# Patient Record
Sex: Male | Born: 1970 | Hispanic: Yes | Marital: Married | Smoking: Former smoker
Health system: Southern US, Community
[De-identification: ages and names within clinical notes are randomized; demographics above are authoritative.]

---

## 2014-07-10 ENCOUNTER — Emergency Department (HOSPITAL_COMMUNITY)
Admission: EM | Admit: 2014-07-10 | Discharge: 2014-07-10 | Disposition: A | Discharge status: Home / Self Care | Payer: Worker's Compensation | Attending: Emergency Medicine | Admitting: Emergency Medicine

## 2014-07-10 ENCOUNTER — Encounter (HOSPITAL_COMMUNITY): Payer: Self-pay | Admitting: Emergency Medicine

## 2014-07-10 DIAGNOSIS — Y929 Unspecified place or not applicable: Secondary | ICD-10-CM | POA: Insufficient documentation

## 2014-07-10 DIAGNOSIS — M545 Low back pain, unspecified: Secondary | ICD-10-CM | POA: Insufficient documentation

## 2014-07-10 DIAGNOSIS — Y939 Activity, unspecified: Secondary | ICD-10-CM | POA: Insufficient documentation

## 2014-07-10 DIAGNOSIS — S335XXA Sprain of ligaments of lumbar spine, initial encounter: Secondary | ICD-10-CM | POA: Insufficient documentation

## 2014-07-10 DIAGNOSIS — X58XXXA Exposure to other specified factors, initial encounter: Secondary | ICD-10-CM | POA: Diagnosis not present

## 2014-07-10 DIAGNOSIS — S39012A Strain of muscle, fascia and tendon of lower back, initial encounter: Secondary | ICD-10-CM

## 2014-07-10 MED ORDER — OXYCODONE-ACETAMINOPHEN 5-325 MG PO TABS
1.0000 | ORAL_TABLET | Freq: Once | ORAL | Status: AC
  Administered 2014-07-10: 1 via ORAL
  Filled 2014-07-10 (×2): qty 1

## 2014-07-10 MED ORDER — OXYCODONE-ACETAMINOPHEN 5-325 MG PO TABS
1.0000 | ORAL_TABLET | Freq: Once | ORAL | Status: AC
Start: 2014-07-10 — End: 2014-07-10
  Administered 2014-07-10: 1 via ORAL
  Filled 2014-07-10: qty 1

## 2014-07-10 MED ORDER — OXYCODONE-ACETAMINOPHEN 5-325 MG PO TABS: 1.0000 | ORAL_TABLET | ORAL | Status: DC | PRN

## 2014-07-10 NOTE — ED Notes (Signed)
Pt stated he fell off a tractor trailer onto concrete on his back on last Thursday and was seen at [facility] for injury. PT c/o worsening in lower back pain into his legs.

## 2014-07-10 NOTE — Discharge Instructions (Signed)
Dolor de espalda en el adulto °(Back Pain, Adult) ° El dolor de cintura es frecuente. Aproximadamente 1 de cada 5 personas lo sufren. La causa rara vez pone en peligro la vida. Con frecuencia mejora luego de algún tiempo. Alrededor de la mitad de las personas que sufren un inicio súbito de dolor de cintura, se sentirán mejor luego de 2 semanas. Aproximadamente 8 de cada 10 se sentirán mejor luego de 6 semanas.  °CAUSAS  °Algunas causas comunes son:  °· Distensión de los músculos o ligamentos que sostienen la columna vertebral. °· Desgaste (degeneración) de los discos vertebrales. °· Artritis. °· Traumatismos directos en la espalda. °DIAGNÓSTICO  °La mayor parte de las veces, la causa directa no se conoce. Sin embargo, el dolor puede tratarse efectivamente aún cuando no se conozca la causa. Una de las formas más precisas de asegurar que la causa del dolor no constituye un peligro es responder a las preguntas del médico acerca de su salud y sus síntomas. Si el médico necesita más información, podrá indicar análisis de laboratorio o realizar un diagnóstico por imágenes (radiografías o resonancia magnética). Sin embargo, aunque las imágenes muestren modificaciones, generalmente no es necesaria la cirugía.  °INSTRUCCIONES PARA EL CUIDADO EN EL HOGAR  °En algunas personas, el dolor de espalda vuelve. Como rara vez es peligroso, los pacientes pueden aprender a manejarlo ellos mismos.  °· Manténgase activo. Si permanece sentado o de pie mucho tiempo en el mismo lugar, se tensiona la espalda.  No se siente, maneje ni se quede parado en un mismo lugar por más de 30 minutos. Realice caminatas cortas en superficies planas ni bien el dolor haya cedido. Trate de aumentar cada día el tiempo que camina . °· No se quede en la cama. Si hace reposo durante más de 1 o 2 días, puede retrasar la recuperación. °· No evite los ejercicios ni el trabajo. El cuerpo está hecho para moverse. No es peligroso estar activo, aunque le duela la  espalda. La espalda se curará más rápido si continúa sus actividades antes de que el dolor se vaya. °· Preste atención a su cuerpo cuando se incline y se levante. Muchas personas sienten menos molestias cuando levantan objetos si doblan las rodillas, mantienen la carga cerca del cuerpo y evitan torcerse. Generalmente, las posiciones más cómodas son las que ejercen menos tensión en la espalda en recuperación. °· Encuentre una posición cómoda para dormir. Utilice un colchón firme y recuéstese de costado. Doble ligeramente sus rodillas. Si se recuesta sobre su espalda, coloque una almohada debajo de sus rodillas. °· Tome sólo medicamentos de venta libre o recetados, según las indicaciones del médico. Los medicamentos de venta libre para calmar el dolor y reducir la inflamación, son los que en general más ayudan. El médico podrá prescribirle relajantes musculares. Estos medicamentos calman el dolor de modo que pueda retornar a sus actividades normales y a realizar ejercicios saludables. °· Aplique hielo sobre la zona lesionada. °¨ Ponga el hielo en una bolsa plástica. °¨ Colóquese una toalla entre la piel y la bolsa de hielo. °¨ Deje la bolsa de hielo durante 15 a 20 minutos 3 a 4 veces por día, durante los primeros 2 ó 3 días. Luego podrá alternar entre calor y hielo para reducir el dolor y los espasmos. °· Consulte a su médico si puede tratar de hacer ejercicios para la espalda y recibir un masaje suave. Pueden ser beneficiosos. °· Evite sentirse ansioso o estresado. El estrés aumenta la tensión muscular y puede empeorar el dolor de espalda. Es importante reconocer cuando está ansioso o estresado y aprender la forma   de controlarlos. El ejercicio es una gran opción. °SOLICITE ATENCIÓN MÉDICA SI:  °· Siente un dolor que no se alivia con reposo o medicamentos. °· El dolor no mejora en 1 semana. °· Desarrolla nuevos síntomas. °· No se siente bien en general. °SOLICITE ATENCIÓN MÉDICA DE INMEDIATO SI: °· Siente un dolor  que se irradia desde la espalda hacia sus piernas. °· Desarrolla nuevos problemas en el intestino o la vejiga. °· Siente debilidad o adormecimiento inusual en sus brazos o piernas. °· Presenta náuseas o vómitos. °· Presenta dolor abdominal. °· Se siente desfalleciente. °Document Released: 09/28/2005 Document Revised: 03/29/2012 °Exit[employer]® Patient Information ©2015 [employer]. This information is not intended to replace advice given to you by your health care provider. Make sure you discuss any questions you have with your health care provider. ° °

## 2014-07-10 NOTE — ED Provider Notes (Signed)
CSN: [id number]     Arrival date & time 07/10/14  1207 History  This chart was scribed for Joya Gaskins, MD by Bronson Curb, ED Scribe. This patient was seen in room APA12/APA12 and the patient's care was started at 12:41 PM.    Chief Complaint  Patient presents with  . Back Pain      Patient is a 43 y.o. male presenting with back pain. The history is provided by the patient. No language interpreter was used.  Back Pain Location:  Lumbar spine Radiates to: Right leg. Pain severity:  Moderate Pain is:  Same all the time Onset quality:  Sudden Duration:  5 days Timing:  Constant Progression:  Unchanged Chronicity:  New Context: falling   Ineffective treatments:  Ibuprofen Associated symptoms: no abdominal pain, no bladder incontinence, no bowel incontinence, no chest pain and no numbness     HPI Comments: Terry Friedman is a 43 y.o. male who presents to the Emergency Department complaining of gradually worsening, constant lower back pain that began after a fall 5 days ago, when he fell off a tractor-trailer truck landing on his back (against concrete)  5 days ago. Patient denies hitting his head or LOC. He states the pain radiates to his right leg. Patient was seen at [facility] for his injury where X-rays were performed on July 05, 2014 which revealed unremarkable findings. He was informed to take Motrin for his pain, however, patient states this has been ineffective. He denies CP, abdominal pain, numbness/weakness, hematuria, bowel/bladder incontinence. Patient denies any previous back surgeries and has no history of significant health conditions.  PMH - none History  Substance Use Topics  . Smoking status: Never Smoker   . Smokeless tobacco: Not on file  . Alcohol Use: Yes     Comment: social     Review of Systems  Cardiovascular: Negative for chest pain.  Gastrointestinal: Negative for abdominal pain and bowel incontinence.  Genitourinary: Negative for  bladder incontinence.  Musculoskeletal: Positive for back pain.  Neurological: Negative for numbness.      Allergies  Review of patient's allergies indicates no known allergies.  Home Medications   Prior to Admission medications   Not on File   Triage Vitals: BP 108/67  Pulse 57  Temp(Src) 97.6 F (36.4 C) (Oral)  Resp 20  Ht 5\' 8"  (1.727 m)  Wt 160 lb (72.576 kg)  BMI 24.33 kg/m2  SpO2 100%  Physical Exam CONSTITUTIONAL: Well developed/well nourished HEAD: Normocephalic/atraumatic EYES: EOMI/PERRL ENMT: Mucous membranes moist NECK: supple no meningeal signs SPINE:entire spine nontender, lumber paraspinal tenderness. No bruising/crepitance/stepoffs noted to spine CV: S1/S2 noted, no murmurs/rubs/gallops noted LUNGS: Lungs are clear to auscultation bilaterally, no apparent distress ABDOMEN: soft, nontender, no rebound or guarding GU:no cva tenderness NEURO: Awake/alert, equal motor 5/5 strength noted with the following: hip flexion/knee flexion/extension, foot dorsi/plantar flexion, great toe extension intact bilaterally, no clonus bilaterally, no sensory deficit in any dermatome.  Equal patellar/achilles reflex noted (2+) in bilateral lower extremities.  Pt is able to ambulate unassisted. EXTREMITIES: pulses normal, full ROM SKIN: warm, color normal. Circular lesions to back from recent massage. PSYCH: no abnormalities of mood noted   ED Course  Procedures   DIAGNOSTIC STUDIES: Oxygen Saturation is 100% on room air, normal by my interpretation.    COORDINATION OF CARE: At 1251 Discussed treatment plan with patient which includes Percocet. Patient agrees.   xrays from [place] [facility] reviewed - no acute lumbar/pelvic fracture (reports from 9/24 noted in  care-everywhere) No new injury since that time.  Defer further imaging at this time Pt ambulatory, no distress, no focal neuro deficits Will give percocet and advised f/u as outpatient With spanish phone interpreter  [phone] we discussed strict return precautions as well as appropriate use of percocet MDM   Final diagnoses:  Lumbar strain, initial encounter    Nursing notes including past medical history and social history reviewed and considered in documentation Previous records reviewed and considered   I personally performed the services described in this documentation, which was scribed in my presence. The recorded information has been reviewed and is accurate.      Joya Gaskinsonald W Emmalea Treanor, MD 07/10/14 1310

## 2014-08-19 ENCOUNTER — Ambulatory Visit: Payer: Self-pay | Admitting: Orthopedic Surgery

## 2014-08-20 ENCOUNTER — Encounter (HOSPITAL_COMMUNITY): Payer: Self-pay

## 2014-08-20 ENCOUNTER — Ambulatory Visit: Payer: Self-pay | Admitting: Orthopedic Surgery

## 2014-08-20 ENCOUNTER — Ambulatory Visit (HOSPITAL_COMMUNITY)
Admission: RE | Admit: 2014-08-20 | Discharge: 2014-08-20 | Disposition: A | Discharge status: Home / Self Care | Payer: Worker's Compensation | Source: Ambulatory Visit | Attending: Specialist | Admitting: Specialist

## 2014-08-20 ENCOUNTER — Encounter (HOSPITAL_COMMUNITY)
Admission: RE | Admit: 2014-08-20 | Discharge: 2014-08-20 | Disposition: A | Discharge status: Home / Self Care | Payer: Worker's Compensation | Source: Ambulatory Visit | Attending: Specialist | Admitting: Specialist

## 2014-08-20 DIAGNOSIS — M5126 Other intervertebral disc displacement, lumbar region: Secondary | ICD-10-CM

## 2014-08-20 DIAGNOSIS — M545 Low back pain: Secondary | ICD-10-CM | POA: Insufficient documentation

## 2014-08-20 DIAGNOSIS — Z01818 Encounter for other preprocedural examination: Secondary | ICD-10-CM

## 2014-08-20 LAB — CBC
HCT: 44.9 % (ref 39.0–52.0)
Hemoglobin: 15.1 g/dL (ref 13.0–17.0)
MCH: 30.3 pg (ref 26.0–34.0)
MCHC: 33.6 g/dL (ref 30.0–36.0)
MCV: 90.2 fL (ref 78.0–100.0)
PLATELETS: 222 10*3/uL (ref 150–400)
RBC: 4.98 MIL/uL (ref 4.22–5.81)
RDW: 12.1 % (ref 11.5–15.5)
WBC: 6.9 10*3/uL (ref 4.0–10.5)

## 2014-08-20 LAB — BASIC METABOLIC PANEL
ANION GAP: 11 (ref 5–15)
BUN: 11 mg/dL (ref 6–23)
CO2: 27 meq/L (ref 19–32)
Calcium: 9.3 mg/dL (ref 8.4–10.5)
Chloride: 100 mEq/L (ref 96–112)
Creatinine, Ser: 0.81 mg/dL (ref 0.50–1.35)
GFR calc Af Amer: >90 mL/min (ref >90)
Glucose, Bld: 104 mg/dL — ABNORMAL HIGH (ref 70–99)
Potassium: 3.9 mEq/L (ref 3.7–5.3)
SODIUM: 138 meq/L (ref 137–147)

## 2014-08-20 LAB — SURGICAL PCR SCREEN
MRSA, PCR: NEGATIVE
Staphylococcus aureus: NEGATIVE

## 2014-08-20 NOTE — Patient Instructions (Addendum)
Terry Friedman  08/20/2014   Your procedure is scheduled on:08/22/2014      Come thru the [facility] entrance .  Follow the Signs to Short Stay Center at  0845      am  Call this number if you have problems the morning of surgery: [phone]   Remember:   Do not eat food or drink liquids after midnight.   Take these medicines the morning of surgery with A SIP OF WATER: oxycodone if needed    Do not wear jewelry,   Do not wear lotions, powders, or perfumes. deodorant.   Men may shave face and neck.  Do not bring valuables to the hospital.  Contacts, dentures or bridgework may not be worn into surgery.  Leave suitcase in the car. After surgery it may be brought to your room.  For patients admitted to the hospital, checkout time is 11:00 AM the day of  discharge.       [place] - Preparing for Surgery Before surgery, you can play an important role.  Because skin is not sterile, your skin needs to be as free of germs as possible.  You can reduce the number of germs on your skin by washing with CHG (chlorahexidine gluconate) soap before surgery.  CHG is an antiseptic cleaner which kills germs and bonds with the skin to continue killing germs even after washing. Please DO NOT use if you have an allergy to CHG or antibacterial soaps.  If your skin becomes reddened/irritated stop using the CHG and inform your nurse when you arrive at Short Stay. Do not shave (including legs and underarms) for at least 48 hours prior to the first CHG shower.  You may shave your face/neck. Please follow these instructions carefully:  1.  Shower with CHG Soap the night before surgery and the  morning of Surgery.  2.  If you choose to wash your hair, wash your hair first as usual with your  normal  shampoo.  3.  After you shampoo, rinse your hair and body thoroughly to remove the  shampoo.                           4.  Use CHG as you would any other liquid soap.  You can apply chg directly  to the skin and wash                        Gently with a scrungie or clean washcloth.  5.  Apply the CHG Soap to your body ONLY FROM THE NECK DOWN.   Do not use on face/ open                           Wound or open sores. Avoid contact with eyes, ears mouth and genitals (private parts).                       Wash face,  Genitals (private parts) with your normal soap.             6.  Wash thoroughly, paying special attention to the area where your surgery  will be performed.  7.  Thoroughly rinse your body with warm water from the neck down.  8.  DO NOT shower/wash with your normal soap after using and rinsing off  the CHG Soap.  9.  Pat yourself dry with a clean towel.            10.  Wear clean pajamas.            11.  Place clean sheets on your bed the night of your first shower and do not  sleep with pets. Day of Surgery : Do not apply any lotions/deodorants the morning of surgery.  Please wear clean clothes to the hospital/surgery center.  FAILURE TO FOLLOW THESE INSTRUCTIONS MAY RESULT IN THE CANCELLATION OF YOUR SURGERY PATIENT SIGNATURE_________________________________  NURSE SIGNATURE__________________________________  ________________________________________________________________________    Please read over the following fact sheets that you were given: MRSA Information, coughing and deep breathing exercises, leg exercises            [place] - Preparing for Surgery Before surgery, you can play an important role.  Because skin is not sterile, your skin needs to be as free of germs as possible.  You can reduce the number of germs on your skin by washing with CHG (chlorahexidine gluconate) soap before surgery.  CHG is an antiseptic cleaner which kills germs and bonds with the skin to continue killing germs even after washing. Please DO NOT use if you have an allergy to CHG or antibacterial soaps.  If your skin becomes reddened/irritated stop using the CHG and inform your nurse when  you arrive at Short Stay. Do not shave (including legs and underarms) for at least 48 hours prior to the first CHG shower.  You may shave your face/neck. Please follow these instructions carefully:  1.  Shower with CHG Soap the night before surgery and the  morning of Surgery.  2.  If you choose to wash your hair, wash your hair first as usual with your  normal  shampoo.  3.  After you shampoo, rinse your hair and body thoroughly to remove the  shampoo.                           4.  Use CHG as you would any other liquid soap.  You can apply chg directly  to the skin and wash                       Gently with a scrungie or clean washcloth.  5.  Apply the CHG Soap to your body ONLY FROM THE NECK DOWN.   Do not use on face/ open                           Wound or open sores. Avoid contact with eyes, ears mouth and genitals (private parts).                       Wash face,  Genitals (private parts) with your normal soap.             6.  Wash thoroughly, paying special attention to the area where your surgery  will be performed.  7.  Thoroughly rinse your body with warm water from the neck down.  8.  DO NOT shower/wash with your normal soap after using and rinsing off  the CHG Soap.                9.  Pat yourself dry with a clean towel.            10.  Wear clean  pajamas.            11.  Place clean sheets on your bed the night of your first shower and do not  sleep with pets. Day of Surgery : Do not apply any lotions/deodorants the morning of surgery.  Please wear clean clothes to the hospital/surgery center.  FAILURE TO FOLLOW THESE INSTRUCTIONS MAY RESULT IN THE CANCELLATION OF YOUR SURGERY PATIENT SIGNATURE_________________________________  NURSE SIGNATURE__________________________________  ________________________________________________________________________

## 2014-08-20 NOTE — H&P (Signed)
Terry Friedman is an 43 y.o. male.   Chief Complaint: back and leg pain HPI: The patient is a 43 year old male who presents today for follow up of their back. The patient is being followed for their mid and low back pain. They are now 6 week(s) out from injury (DOI 07/05/2014). Symptoms reported today include: pain (back) and leg pain (right). Current treatment includes: activity modification, NSAIDs and pain medications. The following medication has been used for pain control: Percocet and ibuprofen. The patient presents today following MRI. Note for "Follow-up back": The patient is out of work. NCM Terry Friedman. The patient follows up with case manager, Terry Friedman.  He is still having left lower extremity radicular pain.  He is here with his translator and Terry Friedman, case manager.    Therefore he is still having significant pain radiating down predominantly down to the left leg. His midthoracic pain is still present, however better.   No past medical history on file.  No past surgical history on file.  No family history on file. Social History:  reports that he has never smoked. He does not have any smokeless tobacco history on file. He reports that he drinks alcohol. He reports that he does not use illicit drugs.  Allergies: No Known Allergies   (Not in a hospital admission)  No results found for this or any previous visit (from the past 48 hour(s)). No results found.  Review of Systems  Constitutional: Negative.   HENT: Negative.   Eyes: Negative.   Respiratory: Negative.   Cardiovascular: Negative.   Gastrointestinal: Negative.   Genitourinary: Negative.   Musculoskeletal: Positive for back pain.  Skin: Negative.   Neurological: Positive for sensory change and focal weakness.  Psychiatric/Behavioral: Negative.     There were no vitals taken for this visit. Physical Exam  Constitutional: He is oriented to person, place, and time. He appears well-developed and  well-nourished.  HENT:  Head: Normocephalic and atraumatic.  Eyes: Conjunctivae and EOM are normal. Pupils are equal, round, and reactive to light.  Neck: Normal range of motion. Neck supple.  Cardiovascular: Normal rate and regular rhythm.   Respiratory: Effort normal and breath sounds normal.  GI: Soft. Bowel sounds are normal.  Musculoskeletal:  He has some pain in the thoracic region with palpation.  He is tender in the left proximal region. Her straight leg raise on the left produces marked buttock, thigh, and calf pain exacerbated with dorsal augmentation maneuvers.  Contralateral straight leg raise is negative.  EHL and plantar flexion is 4+/5.  He has altered sensation in the S1 dermatome.  Decreased Achilles reflex.  No instability of hips, knees, and ankles.  Pelvis is stable.  Abdomen is soft.  Inspection of the cervical spine reveals a normal lordosis without evidence of paraspinous spasms or soft tissue swelling. Nontender to palpation. Full flexion, full extension, full left and right lateral rotation. Extension combined with lateral flexion does not reproduce pain. Negative impingement sign, negative secondary impingement sign of the shoulders. Negative Tinel's median and ulnar nerves at the elbow. Negative carpal compression test at the wrist. Motor of the upper extremities is 5/5 including biceps, triceps, brachioradialis,wrist flexion, wrist extension, finger flexion, finger extension. Reflexes are normoreflexic. Sensory exam is intact to light touch. There is no Hoffmann sign. Nontender over the thoracic spine.   Neurological: He is alert and oriented to person, place, and time. He has normal reflexes.  Skin: Skin is warm and dry.  Psychiatric: He   has a normal mood and affect.    MRI of the thoracic spine demonstrates mild degenerative changes at T5-6 and small profusion at 5-6.   MRI of his lumbar spine demonstrates large disc herniation paracentral to the left, contacts, but  doesn't displace the S1 nerve root. It does displace the S1 nerve root to the left.  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  He has had intermittent left and right sided pain.   Given that he is 6 weeks from his injury and has the presence of a large neurocompressive lesion with a neurotension sign and neurologic deficit, it would be wise to consider lumbar decompression.  The other option is to live with his symptoms.  He indicates he would not like to live with his symptoms.  Through his translator we discussed lumbar decompression.   I had an extensive discussion of the risks and benefits of the lumbar decompression with the patient including bleeding, infection, damage to neurovascular structures, epidural fibrosis, CSF leak requiring repair. We also discussed increase in pain, adjacent segment disease, recurrent disc herniation, need for future surgery including repeat decompression and/or fusion. We also discussed risks of postoperative hematoma, paralysis, anesthetic complications including DVT, PE, death, cardiopulmonary dysfunction. In addition, the perioperative and postoperative courses were discussed in detail including the rehabilitative time and return to functional activity and work. I provided the patient with an illustrated handout and utilized the appropriate surgical models.   We used illustrated handouts and models, extensively the postoperative course including squatting and bending and unsupported bending. We have discussed overnight in the hospital, 2 weeks until suture removal, physical therapy for 6 weeks, and then work conditioning following that with maximal medical improvement at 3 months, possible light duty at 6 weeks.  He has no significant medical history.  He is otherwise healthy. No allergy.  No exposure to MRSA.  Again we  emphasized due to the communication challenge, the postoperative course to diminish the risk of recurrent disc herniation. No tobacco use.  With the kind assistance of his translator and Terry Friedman, case manager, this kindly helped in the postoperative discussion. We will proceed accordingly. If he has any further questions or concerns, he can feel free to [facility] for further discussion.   Plan microlumbar decompression L5-S1 left  BISSELL, Terry Friedman for Dr. Shelle IronBeane 08/20/2014, 11:01 AM

## 2014-08-22 ENCOUNTER — Ambulatory Visit (HOSPITAL_COMMUNITY): Payer: Worker's Compensation

## 2014-08-22 ENCOUNTER — Ambulatory Visit (HOSPITAL_COMMUNITY)
Admission: RE | Admit: 2014-08-22 | Discharge: 2014-08-23 | Disposition: A | Discharge status: Home / Self Care | Payer: Worker's Compensation | Source: Ambulatory Visit | Attending: Specialist | Admitting: Specialist

## 2014-08-22 ENCOUNTER — Encounter (HOSPITAL_COMMUNITY): Payer: Self-pay

## 2014-08-22 ENCOUNTER — Encounter (HOSPITAL_COMMUNITY)
Admission: RE | Disposition: A | Discharge status: Home / Self Care | Payer: Self-pay | Source: Ambulatory Visit | Attending: Specialist

## 2014-08-22 ENCOUNTER — Ambulatory Visit (HOSPITAL_COMMUNITY): Payer: Worker's Compensation | Admitting: Anesthesiology

## 2014-08-22 DIAGNOSIS — M5127 Other intervertebral disc displacement, lumbosacral region: Secondary | ICD-10-CM | POA: Diagnosis not present

## 2014-08-22 DIAGNOSIS — M5126 Other intervertebral disc displacement, lumbar region: Secondary | ICD-10-CM | POA: Diagnosis present

## 2014-08-22 DIAGNOSIS — Z419 Encounter for procedure for purposes other than remedying health state, unspecified: Secondary | ICD-10-CM

## 2014-08-22 DIAGNOSIS — Z87891 Personal history of nicotine dependence: Secondary | ICD-10-CM | POA: Diagnosis not present

## 2014-08-22 DIAGNOSIS — M4806 Spinal stenosis, lumbar region: Secondary | ICD-10-CM | POA: Diagnosis present

## 2014-08-22 HISTORY — PX: LUMBAR LAMINECTOMY/DECOMPRESSION MICRODISCECTOMY: SHX5026

## 2014-08-22 SURGERY — LUMBAR LAMINECTOMY/DECOMPRESSION MICRODISCECTOMY
Anesthesia: General | Site: Back | Laterality: Left

## 2014-08-22 MED ORDER — SODIUM CHLORIDE 0.9 % IV SOLN: 250.0000 mL | INTRAVENOUS | Status: DC

## 2014-08-22 MED ORDER — CEFAZOLIN SODIUM-DEXTROSE 2-3 GM-% IV SOLR
2.0000 g | INTRAVENOUS | Status: AC
  Administered 2014-08-22: 2 g via INTRAVENOUS

## 2014-08-22 MED ORDER — GLYCOPYRROLATE 0.2 MG/ML IJ SOLN
INTRAMUSCULAR | Status: DC | PRN
  Administered 2014-08-22: .4 mg via INTRAVENOUS

## 2014-08-22 MED ORDER — SUCCINYLCHOLINE CHLORIDE 20 MG/ML IJ SOLN
INTRAMUSCULAR | Status: DC | PRN
  Administered 2014-08-22: 120 mg via INTRAVENOUS

## 2014-08-22 MED ORDER — LACTATED RINGERS IV SOLN
INTRAVENOUS | Status: DC
  Administered 2014-08-22: 11:00:00 via INTRAVENOUS

## 2014-08-22 MED ORDER — ONDANSETRON HCL 4 MG/2ML IJ SOLN: 4.0000 mg | INTRAMUSCULAR | Status: DC | PRN

## 2014-08-22 MED ORDER — LIDOCAINE HCL (CARDIAC) 20 MG/ML IV SOLN
INTRAVENOUS | Status: DC | PRN
  Administered 2014-08-22: 75 mg via INTRAVENOUS

## 2014-08-22 MED ORDER — ALUM & MAG HYDROXIDE-SIMETH 200-200-20 MG/5ML PO SUSP: 30.0000 mL | Freq: Four times a day (QID) | ORAL | Status: DC | PRN

## 2014-08-22 MED ORDER — BUPIVACAINE-EPINEPHRINE (PF) 0.5% -1:200000 IJ SOLN
INTRAMUSCULAR | Status: AC
  Filled 2014-08-22: qty 30

## 2014-08-22 MED ORDER — OXYCODONE-ACETAMINOPHEN 5-325 MG PO TABS: 1.0000 | ORAL_TABLET | ORAL | Status: AC | PRN

## 2014-08-22 MED ORDER — NEOSTIGMINE METHYLSULFATE 10 MG/10ML IV SOLN
INTRAVENOUS | Status: AC
  Filled 2014-08-22: qty 1

## 2014-08-22 MED ORDER — KCL IN DEXTROSE-NACL 20-5-0.45 MEQ/L-%-% IV SOLN
INTRAVENOUS | Status: DC
  Administered 2014-08-22 (×2): via INTRAVENOUS
  Filled 2014-08-22 (×3): qty 1000

## 2014-08-22 MED ORDER — THROMBIN 5000 UNITS EX SOLR
CUTANEOUS | Status: AC
  Filled 2014-08-22: qty 10000

## 2014-08-22 MED ORDER — BUPIVACAINE-EPINEPHRINE 0.5% -1:200000 IJ SOLN
INTRAMUSCULAR | Status: DC | PRN
  Administered 2014-08-22: 15 mL

## 2014-08-22 MED ORDER — HYDROMORPHONE HCL 1 MG/ML IJ SOLN: 0.2500 mg | INTRAMUSCULAR | Status: DC | PRN

## 2014-08-22 MED ORDER — FENTANYL CITRATE 0.05 MG/ML IJ SOLN
INTRAMUSCULAR | Status: AC
  Filled 2014-08-22: qty 5

## 2014-08-22 MED ORDER — GLYCOPYRROLATE 0.2 MG/ML IJ SOLN
INTRAMUSCULAR | Status: AC
  Filled 2014-08-22: qty 3

## 2014-08-22 MED ORDER — BISACODYL 10 MG RE SUPP
10.0000 mg | Freq: Every day | RECTAL | Status: DC | PRN
Start: 2014-08-22 — End: 2014-08-23

## 2014-08-22 MED ORDER — PHENOL 1.4 % MT LIQD
1.0000 | OROMUCOSAL | Status: DC | PRN
Start: 2014-08-22 — End: 2014-08-22
  Filled 2014-08-22: qty 177

## 2014-08-22 MED ORDER — METHOCARBAMOL 1000 MG/10ML IJ SOLN
500.0000 mg | Freq: Four times a day (QID) | INTRAVENOUS | Status: DC | PRN
  Administered 2014-08-22: 500 mg via INTRAVENOUS
  Filled 2014-08-22 (×2): qty 5

## 2014-08-22 MED ORDER — LACTATED RINGERS IV SOLN: INTRAVENOUS | Status: DC

## 2014-08-22 MED ORDER — NEOSTIGMINE METHYLSULFATE 10 MG/10ML IV SOLN
INTRAVENOUS | Status: DC | PRN
  Administered 2014-08-22: 4 mg via INTRAVENOUS

## 2014-08-22 MED ORDER — CISATRACURIUM BESYLATE (PF) 10 MG/5ML IV SOLN
INTRAVENOUS | Status: DC | PRN
  Administered 2014-08-22: 8 mg via INTRAVENOUS

## 2014-08-22 MED ORDER — ACETAMINOPHEN 650 MG RE SUPP: 650.0000 mg | RECTAL | Status: DC | PRN

## 2014-08-22 MED ORDER — SODIUM CHLORIDE 0.9 % IJ SOLN: 3.0000 mL | Freq: Two times a day (BID) | INTRAMUSCULAR | Status: DC

## 2014-08-22 MED ORDER — CISATRACURIUM BESYLATE 20 MG/10ML IV SOLN
INTRAVENOUS | Status: AC
  Filled 2014-08-22: qty 10

## 2014-08-22 MED ORDER — MIDAZOLAM HCL 5 MG/5ML IJ SOLN
INTRAMUSCULAR | Status: DC | PRN
  Administered 2014-08-22 (×2): 1 mg via INTRAVENOUS

## 2014-08-22 MED ORDER — MIDAZOLAM HCL 2 MG/2ML IJ SOLN
INTRAMUSCULAR | Status: AC
  Filled 2014-08-22: qty 2

## 2014-08-22 MED ORDER — CEFAZOLIN SODIUM-DEXTROSE 2-3 GM-% IV SOLR
INTRAVENOUS | Status: AC
  Filled 2014-08-22: qty 50

## 2014-08-22 MED ORDER — THROMBIN 5000 UNITS EX SOLR
CUTANEOUS | Status: DC | PRN
  Administered 2014-08-22: 10000 [IU] via TOPICAL

## 2014-08-22 MED ORDER — LIDOCAINE HCL (CARDIAC) 20 MG/ML IV SOLN
INTRAVENOUS | Status: AC
  Filled 2014-08-22: qty 5

## 2014-08-22 MED ORDER — ACETAMINOPHEN 325 MG PO TABS: 650.0000 mg | ORAL_TABLET | ORAL | Status: DC | PRN

## 2014-08-22 MED ORDER — OXYCODONE-ACETAMINOPHEN 5-325 MG PO TABS
1.0000 | ORAL_TABLET | ORAL | Status: DC | PRN
  Administered 2014-08-22 – 2014-08-23 (×2): 1 via ORAL
  Filled 2014-08-22 (×2): qty 1

## 2014-08-22 MED ORDER — ONDANSETRON HCL 4 MG/2ML IJ SOLN
INTRAMUSCULAR | Status: AC
  Filled 2014-08-22: qty 2

## 2014-08-22 MED ORDER — PROPOFOL 10 MG/ML IV BOLUS
INTRAVENOUS | Status: DC | PRN
  Administered 2014-08-22: 175 mg via INTRAVENOUS

## 2014-08-22 MED ORDER — MAGNESIUM CITRATE PO SOLN: 1.0000 | Freq: Once | ORAL | Status: AC | PRN

## 2014-08-22 MED ORDER — METHOCARBAMOL 500 MG PO TABS: 500.0000 mg | ORAL_TABLET | Freq: Four times a day (QID) | ORAL | Status: DC | PRN

## 2014-08-22 MED ORDER — HYDROMORPHONE HCL 1 MG/ML IJ SOLN: 0.5000 mg | INTRAMUSCULAR | Status: DC | PRN

## 2014-08-22 MED ORDER — PROPOFOL 10 MG/ML IV BOLUS
INTRAVENOUS | Status: AC
  Filled 2014-08-22: qty 20

## 2014-08-22 MED ORDER — SODIUM CHLORIDE 0.9 % IJ SOLN: 3.0000 mL | INTRAMUSCULAR | Status: DC | PRN

## 2014-08-22 MED ORDER — ACETAMINOPHEN 10 MG/ML IV SOLN
1000.0000 mg | Freq: Once | INTRAVENOUS | Status: AC
  Administered 2014-08-22: 1000 mg via INTRAVENOUS
  Filled 2014-08-22: qty 100

## 2014-08-22 MED ORDER — DEXAMETHASONE SODIUM PHOSPHATE 10 MG/ML IJ SOLN
INTRAMUSCULAR | Status: AC
  Filled 2014-08-22: qty 1

## 2014-08-22 MED ORDER — METHOCARBAMOL 500 MG PO TABS: 500.0000 mg | ORAL_TABLET | Freq: Three times a day (TID) | ORAL | Status: AC | PRN

## 2014-08-22 MED ORDER — FENTANYL CITRATE 0.05 MG/ML IJ SOLN
INTRAMUSCULAR | Status: DC | PRN
  Administered 2014-08-22: 25 ug via INTRAVENOUS
  Administered 2014-08-22: 50 ug via INTRAVENOUS
  Administered 2014-08-22: 25 ug via INTRAVENOUS

## 2014-08-22 MED ORDER — SENNOSIDES-DOCUSATE SODIUM 8.6-50 MG PO TABS: 1.0000 | ORAL_TABLET | Freq: Every evening | ORAL | Status: DC | PRN

## 2014-08-22 MED ORDER — CEFAZOLIN SODIUM-DEXTROSE 2-3 GM-% IV SOLR
2.0000 g | Freq: Three times a day (TID) | INTRAVENOUS | Status: AC
  Administered 2014-08-22 – 2014-08-23 (×3): 2 g via INTRAVENOUS
  Filled 2014-08-22 (×3): qty 50

## 2014-08-22 MED ORDER — MENTHOL 3 MG MT LOZG
1.0000 | LOZENGE | OROMUCOSAL | Status: DC | PRN
  Filled 2014-08-22: qty 9

## 2014-08-22 MED ORDER — POLYMYXIN B SULFATE 500000 UNITS IJ SOLR
INTRAMUSCULAR | Status: AC
  Filled 2014-08-22: qty 1

## 2014-08-22 MED ORDER — POLYMYXIN B SULFATE 500000 UNITS IJ SOLR
INTRAMUSCULAR | Status: DC | PRN
  Administered 2014-08-22: 500 mL

## 2014-08-22 MED ORDER — DEXAMETHASONE SODIUM PHOSPHATE 10 MG/ML IJ SOLN
INTRAMUSCULAR | Status: DC | PRN
  Administered 2014-08-22: 10 mg via INTRAVENOUS

## 2014-08-22 MED ORDER — HYDROCODONE-ACETAMINOPHEN 5-325 MG PO TABS: 1.0000 | ORAL_TABLET | ORAL | Status: DC | PRN

## 2014-08-22 MED ORDER — DOCUSATE SODIUM 100 MG PO CAPS
100.0000 mg | ORAL_CAPSULE | Freq: Two times a day (BID) | ORAL | Status: DC
  Administered 2014-08-22 – 2014-08-23 (×2): 100 mg via ORAL

## 2014-08-22 MED ORDER — ONDANSETRON HCL 4 MG/2ML IJ SOLN
INTRAMUSCULAR | Status: DC | PRN
  Administered 2014-08-22: 4 mg via INTRAVENOUS

## 2014-08-22 SURGICAL SUPPLY — 53 items
BAG ZIPLOCK 12X15 (MISCELLANEOUS) ×2 IMPLANT
BLADE SURG SZ10 CARB STEEL (BLADE) IMPLANT
CHLORAPREP W/TINT 26ML (MISCELLANEOUS) IMPLANT
CLEANER TIP ELECTROSURG 2X2 (MISCELLANEOUS) ×2 IMPLANT
CLOTH 2% CHLOROHEXIDINE 3PK (PERSONAL CARE ITEMS) ×2 IMPLANT
DECANTER SPIKE VIAL GLASS SM (MISCELLANEOUS) ×2 IMPLANT
DRAPE MICROSCOPE LEICA (MISCELLANEOUS) ×2 IMPLANT
DRAPE POUCH INSTRU U-SHP 10X18 (DRAPES) ×2 IMPLANT
DRAPE SURG 17X11 SM STRL (DRAPES) ×2 IMPLANT
DRAPE UTILITY XL STRL (DRAPES) ×2 IMPLANT
DRSG AQUACEL AG ADV 3.5X 4 (GAUZE/BANDAGES/DRESSINGS) ×2 IMPLANT
DRSG AQUACEL AG ADV 3.5X 6 (GAUZE/BANDAGES/DRESSINGS) IMPLANT
DRSG PAD ABDOMINAL 8X10 ST (GAUZE/BANDAGES/DRESSINGS) IMPLANT
DURAPREP 26ML APPLICATOR (WOUND CARE) ×2 IMPLANT
DURASEAL SPINE SEALANT 3ML (MISCELLANEOUS) ×2 IMPLANT
ELECT BLADE TIP CTD 4 INCH (ELECTRODE) ×2 IMPLANT
ELECT REM PT RETURN 9FT ADLT (ELECTROSURGICAL) ×2
ELECTRODE REM PT RTRN 9FT ADLT (ELECTROSURGICAL) ×1 IMPLANT
GLOVE BIOGEL PI IND STRL 7.5 (GLOVE) ×1 IMPLANT
GLOVE BIOGEL PI IND STRL 8 (GLOVE) ×1 IMPLANT
GLOVE BIOGEL PI INDICATOR 7.5 (GLOVE) ×1
GLOVE BIOGEL PI INDICATOR 8 (GLOVE) ×1
GLOVE SURG SS PI 7.5 STRL IVOR (GLOVE) ×2 IMPLANT
GLOVE SURG SS PI 8.0 STRL IVOR (GLOVE) ×4 IMPLANT
GOWN STRL REUS W/TWL XL LVL3 (GOWN DISPOSABLE) ×6 IMPLANT
IV CATH 14GX2 1/4 (CATHETERS) ×2 IMPLANT
KIT BASIN OR (CUSTOM PROCEDURE TRAY) ×2 IMPLANT
KIT POSITIONING SURG ANDREWS (MISCELLANEOUS) ×2 IMPLANT
MANIFOLD NEPTUNE II (INSTRUMENTS) ×2 IMPLANT
NEEDLE SPNL 18GX3.5 QUINCKE PK (NEEDLE) ×4 IMPLANT
PACK LAMINECTOMY ORTHO (CUSTOM PROCEDURE TRAY) ×2 IMPLANT
PATTIES SURGICAL .5 X.5 (GAUZE/BANDAGES/DRESSINGS) IMPLANT
PATTIES SURGICAL .75X.75 (GAUZE/BANDAGES/DRESSINGS) IMPLANT
PATTIES SURGICAL 1X1 (DISPOSABLE) IMPLANT
SPONGE SURGIFOAM ABS GEL 100 (HEMOSTASIS) ×2 IMPLANT
STAPLER VISISTAT (STAPLE) IMPLANT
STRIP CLOSURE SKIN 1/2X4 (GAUZE/BANDAGES/DRESSINGS) ×2 IMPLANT
SUT NURALON 4 0 TR CR/8 (SUTURE) ×2 IMPLANT
SUT PROLENE 3 0 PS 2 (SUTURE) IMPLANT
SUT VIC AB 0 CT1 27 (SUTURE)
SUT VIC AB 0 CT1 27XBRD ANTBC (SUTURE) IMPLANT
SUT VIC AB 1 CT1 27 (SUTURE)
SUT VIC AB 1 CT1 27XBRD ANTBC (SUTURE) IMPLANT
SUT VIC AB 1-0 CT2 27 (SUTURE) ×4 IMPLANT
SUT VIC AB 2-0 CT1 27 (SUTURE) ×1
SUT VIC AB 2-0 CT1 TAPERPNT 27 (SUTURE) ×1 IMPLANT
SUT VIC AB 2-0 CT2 27 (SUTURE) ×2 IMPLANT
SUT VICRYL 0 27 CT2 27 ABS (SUTURE) ×2 IMPLANT
SUT VICRYL 0 UR6 27IN ABS (SUTURE) IMPLANT
SYR 3ML LL SCALE MARK (SYRINGE) ×2 IMPLANT
SYRINGE 10CC LL (SYRINGE) ×2 IMPLANT
TOWEL OR 17X26 10 PK STRL BLUE (TOWEL DISPOSABLE) ×4 IMPLANT
YANKAUER SUCT BULB TIP NO VENT (SUCTIONS) ×2 IMPLANT

## 2014-08-22 NOTE — Discharge Instructions (Signed)
Walk As Tolerated utilizing back precautions.  No bending, twisting, or lifting.  No driving for 2 weeks.   °Aquacel dressing may remain in place for 7 days. May shower with aquacel dressing in place. After 7 days, remove aquacel dressing and place gauze and tape dressing which should be kept clean and dry and changed daily. Do not remove steri-strips if they are present. °See Dr. Jora Galluzzo in office in 10 to 14 days. Begin taking aspirin 81mg per day starting 4 days after your surgery if not allergic to aspirin or on another blood thinner. °Walk daily even outside. Use a cane or walker only if necessary. °Avoid sitting on soft sofas. ° °

## 2014-08-22 NOTE — H&P (View-Only) (Signed)
Terry Friedman is an 43 y.o. male.   Chief Complaint: back and leg pain HPI: The patient is a 43 year old male who presents today for follow up of their back. The patient is being followed for their mid and low back pain. They are now 6 week(s) out from injury (DOI 07/05/2014). Symptoms reported today include: pain (back) and leg pain (right). Current treatment includes: activity modification, NSAIDs and pain medications. The following medication has been used for pain control: Percocet and ibuprofen. The patient presents today following MRI. Note for "Follow-up back": The patient is out of work. NCM Meg Blackwood. The patient follows up with case manager, Carrolyn LeighMeg Blackwood.  He is still having left lower extremity radicular pain.  He is here with his translator and Carrolyn LeighMeg Blackwood, case Production designer, theatre/television/filmmanager.    Therefore he is still having significant pain radiating down predominantly down to the left leg. His midthoracic pain is still present, however better.   No past medical history on file.  No past surgical history on file.  No family history on file. Social History:  reports that he has never smoked. He does not have any smokeless tobacco history on file. He reports that he drinks alcohol. He reports that he does not use illicit drugs.  Allergies: No Known Allergies   (Not in a hospital admission)  No results found for this or any previous visit (from the past 48 hour(s)). No results found.  Review of Systems  Constitutional: Negative.   HENT: Negative.   Eyes: Negative.   Respiratory: Negative.   Cardiovascular: Negative.   Gastrointestinal: Negative.   Genitourinary: Negative.   Musculoskeletal: Positive for back pain.  Skin: Negative.   Neurological: Positive for sensory change and focal weakness.  Psychiatric/Behavioral: Negative.     There were no vitals taken for this visit. Physical Exam  Constitutional: He is oriented to person, place, and time. He appears well-developed and  well-nourished.  HENT:  Head: Normocephalic and atraumatic.  Eyes: Conjunctivae and EOM are normal. Pupils are equal, round, and reactive to light.  Neck: Normal range of motion. Neck supple.  Cardiovascular: Normal rate and regular rhythm.   Respiratory: Effort normal and breath sounds normal.  GI: Soft. Bowel sounds are normal.  Musculoskeletal:  He has some pain in the thoracic region with palpation.  He is tender in the left proximal region. Her straight leg raise on the left produces marked buttock, thigh, and calf pain exacerbated with dorsal augmentation maneuvers.  Contralateral straight leg raise is negative.  EHL and plantar flexion is 4+/5.  He has altered sensation in the S1 dermatome.  Decreased Achilles reflex.  No instability of hips, knees, and ankles.  Pelvis is stable.  Abdomen is soft.  Inspection of the cervical spine reveals a normal lordosis without evidence of paraspinous spasms or soft tissue swelling. Nontender to palpation. Full flexion, full extension, full left and right lateral rotation. Extension combined with lateral flexion does not reproduce pain. Negative impingement sign, negative secondary impingement sign of the shoulders. Negative Tinel's median and ulnar nerves at the elbow. Negative carpal compression test at the wrist. Motor of the upper extremities is 5/5 including biceps, triceps, brachioradialis,wrist flexion, wrist extension, finger flexion, finger extension. Reflexes are normoreflexic. Sensory exam is intact to light touch. There is no Hoffmann sign. Nontender over the thoracic spine.   Neurological: He is alert and oriented to person, place, and time. He has normal reflexes.  Skin: Skin is warm and dry.  Psychiatric: He  has a normal mood and affect.    MRI of the thoracic spine demonstrates mild degenerative changes at T5-6 and small profusion at 5-6.   MRI of his lumbar spine demonstrates large disc herniation paracentral to the left, contacts, but  doesn't displace the S1 nerve root. It does displace the S1 nerve root to the left.  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  He has had intermittent left and right sided pain.   Given that he is 6 weeks from his injury and has the presence of a large neurocompressive lesion with a neurotension sign and neurologic deficit, it would be wise to consider lumbar decompression.  The other option is to live with his symptoms.  He indicates he would not like to live with his symptoms.  Through his translator we discussed lumbar decompression.   I had an extensive discussion of the risks and benefits of the lumbar decompression with the patient including bleeding, infection, damage to neurovascular structures, epidural fibrosis, CSF leak requiring repair. We also discussed increase in pain, adjacent segment disease, recurrent disc herniation, need for future surgery including repeat decompression and/or fusion. We also discussed risks of postoperative hematoma, paralysis, anesthetic complications including DVT, PE, death, cardiopulmonary dysfunction. In addition, the perioperative and postoperative courses were discussed in detail including the rehabilitative time and return to functional activity and work. I provided the patient with an illustrated handout and utilized the appropriate surgical models.   We used illustrated handouts and models, extensively the postoperative course including squatting and bending and unsupported bending. We have discussed overnight in the hospital, 2 weeks until suture removal, physical therapy for 6 weeks, and then work conditioning following that with maximal medical improvement at 3 months, possible light duty at 6 weeks.  He has no significant medical history.  He is otherwise healthy. No allergy.  No exposure to MRSA.  Again we  emphasized due to the communication challenge, the postoperative course to diminish the risk of recurrent disc herniation. No tobacco use.  With the kind assistance of his translator and Carrolyn LeighMeg Blackwood, case manager, this kindly helped in the postoperative discussion. We will proceed accordingly. If he has any further questions or concerns, he can feel free to contact [facility] for further discussion.   Plan microlumbar decompression L5-S1 left  Veniamin Kincaid M. PA-C for Dr. Shelle IronBeane 08/20/2014, 11:01 AM

## 2014-08-22 NOTE — Anesthesia Postprocedure Evaluation (Signed)
  Anesthesia Post-op Note  Patient: Research scientist (medical)Terry Friedman  Procedure(s) Performed: Procedure(s) (LRB): MICRO LUMBAR DECOMPRESSION L5-S1 LEFT  (Left)  Patient Location: PACU  Anesthesia Type: General  Level of Consciousness: awake and alert   Airway and Oxygen Therapy: Patient Spontanous Breathing  Post-op Pain: mild  Post-op Assessment: Post-op Vital signs reviewed, Patient's Cardiovascular Status Stable, Respiratory Function Stable, Patent Airway and No signs of Nausea or vomiting  Last Vitals:  Filed Vitals:   08/22/14 1325  BP: 113/63  Pulse: 53  Temp: 36.7 C  Resp: 16    Post-op Vital Signs: stable   Complications: No apparent anesthesia complications

## 2014-08-22 NOTE — Interval H&P Note (Signed)
History and Physical Interval Note:  08/22/2014 8:41 AM  Terry Friedman  has presented today for surgery, with the diagnosis of HNP L5-S1 LEFT   The various methods of treatment have been discussed with the patient and family. After consideration of risks, benefits and other options for treatment, the patient has consented to  Procedure(s): MICRO LUMBAR DECOMPRESSION L5-S1 LEFT  (Left) as a surgical intervention .  The patient's history has been reviewed, patient examined, no change in status, stable for surgery.  I have reviewed the patient's chart and labs.  Questions were answered to the patient's satisfaction.     Talita Recht C

## 2014-08-22 NOTE — Anesthesia Procedure Notes (Signed)
Procedure Name: Intubation Date/Time: 08/22/2014 11:08 AM Performed by: [name], Terry Friedman-anesthesia Checklist: Patient identified, Emergency Drugs available, Suction available, Patient being monitored and Timeout performed Patient Re-evaluated:Patient Re-evaluated prior to inductionOxygen Delivery Method: Circle system utilized Preoxygenation: Friedman-oxygenation with 100% oxygen Intubation Type: IV induction Ventilation: Mask ventilation without difficulty Laryngoscope Size: Mac and 4 Grade View: Grade II Tube type: Oral Tube size: 7.5 mm Number of attempts: 1 Airway Equipment and Method: Stylet Secured at: 21 cm Tube secured with: Tape Dental Injury: Teeth and Oropharynx as per Friedman-operative assessment  Comments: loose tooth left front (preop) still intact after intubation

## 2014-08-22 NOTE — Plan of Care (Signed)
Problem: Phase I Progression Outcomes Goal: Hemodynamically stable Outcome: Completed/Met Date Met:  08/22/14     

## 2014-08-22 NOTE — Plan of Care (Signed)
Problem: Phase I Progression Outcomes Goal: OOB as tolerated unless otherwise ordered Outcome: Completed/Met Date Met:  08/22/14     

## 2014-08-22 NOTE — Brief Op Note (Signed)
08/22/2014  12:07 PM  PATIENT:  Terry Friedman  43 y.o. male  PRE-OPERATIVE DIAGNOSIS:  HNP L5-S1 LEFT   POST-OPERATIVE DIAGNOSIS:  HNP L5-S1 LEFT   PROCEDURE:  Procedure(s): MICRO LUMBAR DECOMPRESSION L5-S1 LEFT  (Left)  SURGEON:  Surgeon(s) and Role:    * Javier DockerJeffrey C Kally Cadden, MD - Primary  PHYSICIAN ASSISTANT:   ASSISTANTS: Bissell   ANESTHESIA:   general  EBL:     BLOOD ADMINISTERED:none  DRAINS: none   LOCAL MEDICATIONS USED:  MARCAINE     SPECIMEN:  Source of Specimen:  L5S1  DISPOSITION OF SPECIMEN:  PATHOLOGY  COUNTS:  YES  TOURNIQUET:  * No tourniquets in log *  DICTATION: .Other Dictation: Dictation Number [id number]  PLAN OF CARE: Admit for overnight observation  PATIENT DISPOSITION:  PACU - hemodynamically stable.   Delay start of Pharmacological VTE agent (>24hrs) due to surgical blood loss or risk of bleeding: yes

## 2014-08-22 NOTE — Plan of Care (Signed)
Problem: Phase I Progression Outcomes Goal: Log roll for position change Outcome: Completed/Met Date Met:  08/22/14

## 2014-08-22 NOTE — Transfer of Care (Signed)
Immediate Anesthesia Transfer of Care Note  Patient: Terry Friedman  Procedure(s) Performed: Procedure(s): MICRO LUMBAR DECOMPRESSION L5-S1 LEFT  (Left)  Patient Location: PACU  Anesthesia Type:General  Level of Consciousness: awake, oriented, patient cooperative, lethargic and responds to stimulation  Airway & Oxygen Therapy: Patient Spontanous Breathing and Patient connected to face mask oxygen  Post-op Assessment: Report given to PACU RN, Post -op Vital signs reviewed and stable and Patient moving all extremities  Post vital signs: Reviewed and stable  Complications: No apparent anesthesia complications

## 2014-08-22 NOTE — Plan of Care (Signed)
Problem: Consults Goal: Spinal Surgery Patient Education See Patient Education Module for education specifics. Outcome: Progressing Goal: Diagnosis - Spinal Surgery Microdiscectomy Goal: Skin Care Protocol Initiated - if Braden Score 18 or less If consults are not indicated, leave blank or document N/A Outcome: Not Applicable Date Met:  10/62/69 Goal: Nutrition Consult-if indicated Outcome: Not Applicable Date Met:  48/54/62 Goal: Diabetes Guidelines if Diabetic/Glucose > 140 If diabetic or lab glucose is > 140 mg/dl - Initiate Diabetes/Hyperglycemia Guidelines & Document Interventions  Outcome: Not Applicable Date Met:  70/35/00  Problem: Phase I Progression Outcomes Goal: Pain controlled with appropriate interventions Outcome: Progressing

## 2014-08-22 NOTE — Op Note (Signed)
NAMDarrold Junker:  Vey, Seng               ACCOUNT NO.:  [id number]  MEDICAL RECORD NO.:  [id number]  LOCATION:  1614                         [facility]  PHYSICIAN:  Jene EveryJeffrey Vinod Mikesell, M.D.    DATE OF BIRTH:  03-Mar-1971  DATE OF PROCEDURE: DATE OF DISCHARGE:                              OPERATIVE REPORT   PREOPERATIVE DIAGNOSIS:  SPINAL STENOSIS, HERNIATED NUCLEUS PULPOSUS, L5- S1 LEFT.  POSTOPERATIVE DIAGNOSIS:  SPINAL STENOSIS, HERNIATED NUCLEUS PULPOSUS, L5-S1 LEFT.  PROCEDURE PERFORMED: 1. Micro lumbar decompression L5-S1, left. 2. Foraminotomies, S1 and L5.  ANESTHESIA:  General.  ASSISTANT:  Lanna PocheJacqueline Bissell, PA.  HISTORY:  A 43 year old, left lower extremity radicular pain secondary to large disk herniation, compressing the S1 nerve root, the disk degeneration at 5-1 indicated for decompression, foraminotomies, and microdiskectomy.  Risks and benefits discussed including bleeding, infection, damage to neurovascular structures, DVT, PE, anesthetic complications, etc.  TECHNIQUE:  The patient in supine position, after induction of adequate general anesthesia, 2 g Kefzol, placed prone on the MarmarthAndrews frame.  All bony prominences were well padded.  Lumbar region was prepped and draped in usual sterile fashion.  Two 18-gauge spinal needle was utilized to localize the 5-1 interspace, confirmed with x-ray.  Incision was made from the spinous processes of 5-1, subcutaneous tissue was dissected, electrocautery was utilized to achieve the hemostasis, infiltrated paraspinous musculature with 0.25% Marcaine with epinephrine. Paraspinous muscle was elevated.  McCullough retractor was placed. Operating microscope was draped and brought on the surgical field with confirmatory radiograph obtained at L5-S1.  Detached ligamentum flavum from the cephalad edge of S1 utilizing straight curette.  Performed a foraminotomy of S1, placed a neuro patty beneath the ligamentum flavum. We removed  the ligamentum flavum where as disk herniation was into the axilla, displacing the S1 nerve root in the lateral recess, decompressed lateral recess to the medial border of the pedicle, performed a generous foraminotomy of S1, still unable to mobilize the nerve root medially. Therefore within the axilla with the elements well protected, performed an annulotomy and copious portion of distal tear was removed from the disk with a straight and upbiting pituitary, further mobilized with a nerve hook.  I then gently mobilized the S1 nerve root, decompression in the lateral recess to medial border of the pedicle into the foramen of 5, performed foraminotomy.  We performed a full diskectomy with multiple passes, again checked within the disk space, no residual disk herniation, irrigated the disk space with catheter, antibiotic irrigation, or no residual disk herniation.  I checked beneath thecal sac, axilla of the S1 nerve root, shoulder, foramen of 5 and S1.  No residual disk herniation.  A 1 cm of excursion of the S1 nerve root medial of the pedicle was performed without difficulty.  Good restoration of the thecal sac and the S1 nerve root which had been compressed in the lateral recess due to the disk herniation.  Inspection revealed no CSF leakage or active bleeding.  We draped the epidural fat over the S1 nerve root.  We removed the [facility] retractor, irrigated after confirmatory radiograph obtained at L5-S1, closed the fascia with 1 Vicryl, subcu with 2-0, irrigating throughout subcu with 4-0 subcuticular  Prolene.  Sterile dressing applied, placed supine on the hospital bed, extubated without difficulty, and transported to the recovery room in satisfactory condition.  The patient tolerated the procedure well.  No complications.  Minimal blood loss.  Assistant, Lanna PocheJacqueline Bissell, PA, she was required throughout the case, patient positioning, gentle intermittent neural traction, suction,  and closure.     Jene EveryJeffrey Aquanetta Schwarz, M.D.     Cordelia PenJB/MEDQ  D:  08/22/2014  T:  08/22/2014  Job:  [id number]

## 2014-08-22 NOTE — Plan of Care (Signed)
Problem: Phase I Progression Outcomes Goal: Pain controlled with appropriate interventions Outcome: Completed/Met Date Met:  08/22/14     

## 2014-08-22 NOTE — Anesthesia Preprocedure Evaluation (Addendum)
Anesthesia Evaluation  Patient identified by MRN, date of birth, ID band Patient awake    Reviewed: Allergy & Precautions, H&P , NPO status , Patient's Chart, lab work & pertinent test results  Airway Mallampati: II  TM Distance: >3 FB Neck ROM: full    Dental  (+) Dental Advisory Given, Loose Very loose right upper front tooth.   Risk of aspiration of tooth explained.:   Pulmonary neg pulmonary ROS, former smoker,  breath sounds clear to auscultation  Pulmonary exam normal       Cardiovascular Exercise Tolerance: Good negative cardio ROS  Rhythm:regular Rate:Normal     Neuro/Psych negative neurological ROS  negative psych ROS   GI/Hepatic negative GI ROS, Neg liver ROS,   Endo/Other  negative endocrine ROS  Renal/GU negative Renal ROS  negative genitourinary   Musculoskeletal   Abdominal   Peds  Hematology negative hematology ROS (+)   Anesthesia Other Findings   Reproductive/Obstetrics negative OB ROS                            Anesthesia Physical Anesthesia Plan  ASA: I  Anesthesia Plan: General   Post-op Pain Management:    Induction: Intravenous  Airway Management Planned: Oral ETT  Additional Equipment:   Intra-op Plan:   Post-operative Plan: Extubation in OR  Informed Consent: I have reviewed the patients History and Physical, chart, labs and discussed the procedure including the risks, benefits and alternatives for the proposed anesthesia with the patient or authorized representative who has indicated his/her understanding and acceptance.   Dental Advisory Given  Plan Discussed with: CRNA and Surgeon  Anesthesia Plan Comments:         Anesthesia Quick Evaluation

## 2014-08-23 ENCOUNTER — Encounter (HOSPITAL_COMMUNITY): Payer: Self-pay | Admitting: Specialist

## 2014-08-23 DIAGNOSIS — M4806 Spinal stenosis, lumbar region: Secondary | ICD-10-CM | POA: Diagnosis not present

## 2014-08-23 NOTE — Evaluation (Signed)
Occupational Therapy Evaluation Patient Details Name: Terry Friedman MRN: [id number] DOB: 1971-05-02 Today's Date: 08/23/2014    History of Present Illness Pt is s/p micro lumbar decompression L5-S1, left   Clinical Impression   Pt educated on all back precautions and issued handout. Practiced up in room with ADL and pt tolerated well. No further acute OT needs.     Follow Up Recommendations  No OT follow up;Supervision - Intermittent    Equipment Recommendations  3 in 1 bedside comode    Recommendations for Other Services       Precautions / Restrictions Precautions Precautions: Back Precaution Booklet Issued: Yes (comment) Precaution Comments: Issued back care handout and reviewed all information and precautions.      Mobility Bed Mobility Overal bed mobility: Needs Assistance Bed Mobility: Rolling;Sidelying to Sit Rolling: Min guard Sidelying to sit: Min guard       General bed mobility comments: increaed time and verbal cues for log roll  Transfers Overall transfer level: Needs assistance Equipment used: None Transfers: Sit to/from Stand Sit to Stand: Min guard         General transfer comment: verbal cues for back precautions.    Balance                                            ADL Overall ADL's : Needs assistance/impaired Eating/Feeding: Independent;Sitting   Grooming: Wash/dry hands;Min guard;Standing   Upper Body Bathing: Set up;Sitting   Lower Body Bathing: Min guard;Sit to/from stand   Upper Body Dressing : Set up;Sitting   Lower Body Dressing: Minimal assistance;Sit to/from stand Lower Body Dressing Details (indicate cue type and reason): min only to help with gripper sock that was sticking to pants.  Toilet Transfer: Min guard;Ambulation;BSC   Toileting- ArchitectClothing Manipulation and Hygiene: Min guard;Sit to/from stand   Tub/ Shower Transfer: Tub transfer;Min guard     General ADL Comments: Educated pt on all back  precautions and issued handout. Pt states he is able to read and understand the handout information. Pt able to step over tub and educated on LHS option and where to obtain. Pt did well crossing LEs up to don pants.      Vision                     Perception     Praxis      Pertinent Vitals/Pain Pain Assessment: 0-10 Pain Score: 4  Pain Location: back  Pain Descriptors / Indicators: Aching Pain Intervention(s): Repositioned;Monitored during session     Hand Dominance     Extremity/Trunk Assessment Upper Extremity Assessment Upper Extremity Assessment: Overall WFL for tasks assessed           Communication Communication Communication: No difficulties   Cognition Arousal/Alertness: Awake/alert Behavior During Therapy: WFL for tasks assessed/performed Overall Cognitive Status: Within Functional Limits for tasks assessed                     General Comments       Exercises       Shoulder Instructions      Home Living Family/patient expects to be discharged to:: Private residence Living Arrangements: Spouse/significant other Available Help at Discharge: Family Type of Home: Mobile home Home Access: Stairs to enter Entrance Stairs-Number of Steps: 6 Entrance Stairs-Rails: Right;Left;Can reach both Home Layout: One level  Bathroom Shower/Tub: Doctor, general practiceTub/shower unit   Bathroom Toilet: Standard     Home Equipment: None          Prior Functioning/Environment Level of Independence: Independent             OT Diagnosis: Generalized weakness   OT Problem List:     OT Treatment/Interventions:      OT Goals(Current goals can be found in the care plan section) Acute Rehab OT Goals Patient Stated Goal: none stated. agreeable to OT OT Goal Formulation: With patient  OT Frequency:     Barriers to D/C:            Co-evaluation              End of Session    Activity Tolerance: Patient tolerated treatment well Patient left: in  chair;with call bell/phone within reach   Time: [id number] OT Time Calculation (min): 28 min Charges:  OT General Charges $OT Visit: 1 Procedure OT Evaluation $Initial OT Evaluation Tier I: 1 Procedure OT Treatments $Self Care/Home Management : 8-22 mins $Therapeutic Activity: 8-22 mins G-Codes: OT G-codes **NOT FOR INPATIENT CLASS** Functional Assessment Tool Used: clinical judgement Functional Limitation: Self care Self Care Current Status ([id number]): At least 1 percent but less than 20 percent impaired, limited or restricted Self Care Goal Status ([id number](G8988): At least 1 percent but less than 20 percent impaired, limited or restricted Self Care Discharge Status [phone](G8989): At least 1 percent but less than 20 percent impaired, limited or restricted  Lennox LaityStone, Roy Tokarz Stafford  [phone] 08/23/2014, 9:21 AM

## 2014-08-23 NOTE — Progress Notes (Addendum)
Subjective: 1 Day Post-Op Procedure(s) (LRB): MICRO LUMBAR DECOMPRESSION L5-S1 LEFT  (Left) Patient reports pain as mild.  Seen in rounds this morning by Dr. Shelle IronBeane. Reports he is doing well. Pain improved.  Objective: Vital signs in last 24 hours: Temp:  [97.6 F (36.4 C)-98.4 F (36.9 C)] 97.7 F (36.5 C) (11/12 0929) Pulse Rate:  [53-79] 67 (11/12 0929) Resp:  [11-19] 16 (11/12 0929) BP: (105-144)/(62-81) 144/64 mmHg (11/12 0929) SpO2:  [97 %-100 %] 97 % (11/12 0929)  Intake/Output from previous day: 11/11 0701 - 11/12 0700 In: 2105 [P.O.:600; I.V.:1400; IV Piggyback:105] Out: 3375 [Urine:3375] Intake/Output this shift: Total I/O In: 240 [P.O.:240] Out: 525 [Urine:525]   Recent Labs  08/20/14 1400  HGB 15.1    Recent Labs  08/20/14 1400  WBC 6.9  RBC 4.98  HCT 44.9  PLT 222    Recent Labs  08/20/14 1400  NA 138  K 3.9  CL 100  CO2 27  BUN 11  CREATININE 0.81  GLUCOSE 104*  CALCIUM 9.3   No results for input(s): LABPT, INR in the last 72 hours.  Neurologically intact ABD soft Neurovascular intact Sensation intact distally Intact pulses distally Dorsiflexion/Plantar flexion intact Incision: dressing C/D/I and no drainage No cellulitis present Compartment soft no sign of DVT  Assessment/Plan: 1 Day Post-Op Procedure(s) (LRB): MICRO LUMBAR DECOMPRESSION L5-S1 LEFT  (Left) Advance diet Up with therapy D/C IV fluids  Reviewed D/C instructions, dressing instructions, Lspine precautions D/C today to home with HHPT after PT Follow up 10-14 days post-op for suture removal  Terry Friedman M. 08/23/2014, 10:35 AM

## 2014-08-23 NOTE — Care Management Note (Signed)
    Page 1 of 1   08/23/2014     12:06:45 PM CARE MANAGEMENT NOTE 08/23/2014  Patient:  NIVIN, BRANIFF   Account Number:  [id number]  Date Initiated:  08/23/2014  Documentation initiated by:  [facility]  Subjective/Objective Assessment:   adm: MICRO LUMBAR DECOMPRESSION L5-S1 LEFT  (Left)     Action/Plan:   discharge planning   Anticipated DC Date:  08/23/2014   Anticipated DC Plan:  [place]  CM consult      Choice offered to / List presented to:     DME arranged  3-N-1           Status of service:  Completed, signed off Medicare Important Message given?   (If response is "NO", the following Medicare IM given date fields will be blank) Date Medicare IM given:   Medicare IM given by:   Date Additional Medicare IM given:   Additional Medicare IM given by:    Discharge Disposition:  HOME/SELF CARE  Per UR Regulation:    If discussed at Long Length of Stay Meetings, dates discussed:    Comments:  08/23/14 CM spoke with pt at 15:00 08/22/14 through interpretive services and requested a contact name/number for his Worker's Comp; pt states he is unaware of specific agent but his wife and son are coming on 11/12 and he will ask them to bring any information to help set up any HH or DME wihich might be ordered. CM then met with family today at 12:00 and no information on specific contact.  CM called main number [phone] and was referred to Thedora Hinders (who was not in); CM called back and was referred to [place] who stated the best way to obtain a 3n1 today was with a prescription and have pt fax copy of prescription and receipt to Thedora Hinders at [phone] for reimbursement.  CM explained the plan to the family who agreed to go to the Medical supply store on [address] for the 3n1.  CM  also, faxed the prescriptions to the [facility] with OT recc, Op note, H&P and DC smmary.  No other CM needs were communicated.  Mariane Masters, BSN, CM  [phone].

## 2014-08-23 NOTE — Discharge Summary (Signed)
Physician Discharge Summary   Patient ID: Terry Friedman MRN: [id number] DOB/AGE: [birth year] 43 y.o.  Admit date: 08/22/2014 Discharge date: 08/23/2014  Primary Diagnosis:   HNP L5-S1 LEFT   Admission Diagnoses:  History reviewed. No pertinent past medical history. Discharge Diagnoses:   Active Problems:   HNP (herniated nucleus pulposus), lumbar  Procedure:  Procedure(s) (LRB): MICRO LUMBAR DECOMPRESSION L5-S1 LEFT  (Left)   Consults: None  HPI:  see H&P    Laboratory Data: Hospital Outpatient Visit on 08/20/2014  Component Date Value Ref Range Status  . WBC 08/20/2014 6.9  4.0 - 10.5 K/uL Final  . RBC 08/20/2014 4.98  4.22 - 5.81 MIL/uL Final  . Hemoglobin 08/20/2014 15.1  13.0 - 17.0 g/dL Final  . HCT 08/20/2014 44.9  39.0 - 52.0 % Final  . MCV 08/20/2014 90.2  78.0 - 100.0 fL Final  . MCH 08/20/2014 30.3  26.0 - 34.0 pg Final  . MCHC 08/20/2014 33.6  30.0 - 36.0 g/dL Final  . RDW 08/20/2014 12.1  11.5 - 15.5 % Final  . Platelets 08/20/2014 222  150 - 400 K/uL Final  . Sodium 08/20/2014 138  137 - 147 mEq/L Final  . Potassium 08/20/2014 3.9  3.7 - 5.3 mEq/L Final  . Chloride 08/20/2014 100  96 - 112 mEq/L Final  . CO2 08/20/2014 27  19 - 32 mEq/L Final  . Glucose, Bld 08/20/2014 104* 70 - 99 mg/dL Final  . BUN 08/20/2014 11  6 - 23 mg/dL Final  . Creatinine, Ser 08/20/2014 0.81  0.50 - 1.35 mg/dL Final  . Calcium 08/20/2014 9.3  8.4 - 10.5 mg/dL Final  . GFR calc non Af Amer 08/20/2014 >90  >90 mL/min Final  . GFR calc Af Amer 08/20/2014 >90  >90 mL/min Final   Comment: (NOTE) The eGFR has been calculated using the CKD EPI equation. This calculation has not been validated in all clinical situations. eGFR's persistently <90 mL/min signify possible Chronic Kidney Disease.   . Anion gap 08/20/2014 11  5 - 15 Final  . MRSA, PCR 08/20/2014 NEGATIVE  NEGATIVE Final  . Staphylococcus aureus 08/20/2014 NEGATIVE  NEGATIVE Final   Comment:        The Xpert SA Assay  (FDA approved for NASAL specimens in patients over 84 years of age), is one component of a comprehensive surveillance program.  Test performance has been validated by [employer] for patients greater than or equal to 50 year old. It is not intended to diagnose infection nor to guide or monitor treatment.     Recent Labs  08/20/14 1400  HGB 15.1    Recent Labs  08/20/14 1400  WBC 6.9  RBC 4.98  HCT 44.9  PLT 222    Recent Labs  08/20/14 1400  NA 138  K 3.9  CL 100  CO2 27  BUN 11  CREATININE 0.81  GLUCOSE 104*  CALCIUM 9.3   No results for input(s): LABPT, INR in the last 72 hours.  X-Rays:X-ray Lumbar Spine Ap And Lateral  08/20/2014   CLINICAL DATA:  Low back pain with radiation to left leg. Initial evaluation.  EXAM: LUMBAR SPINE - 2-3 VIEW  COMPARISON:  None.  FINDINGS: Soft tissue structures are unremarkable. Normal alignment and mineralization. No acute bony abnormality.  IMPRESSION: No acute or focal abnormality.   Electronically Signed   By: Excel   On: 08/20/2014 14:30   Dg Spine Portable 1 View  08/22/2014   CLINICAL DATA:  Planned  surgical level with L5-S1  EXAM: PORTABLE SPINE - 1 VIEW  COMPARISON:  Lateral view of the lumbar spine labeled image #1.  FINDINGS: The scan spur under device and the metallic trocar overlie the inferior aspect of the L5 spinous process. The tip of the trocar lies 2.8 cm posterior to the posterior margin of the L5-S1 disc space.  IMPRESSION: The mid metallic heart wire positioning is as described.   Electronically Signed   By: David  [place]   On: 08/22/2014 13:33   Dg Spine Portable 1 View  08/22/2014   CLINICAL DATA:  Back surgery.  EXAM: PORTABLE SPINE - 1 VIEW  COMPARISON:  08/20/2014.  FINDINGS: Metallic markers are noted posteriorly at the L4-L5 and L5-S1 disc levels. Degenerative changes lumbar spine.  IMPRESSION: Metallic markers noted posterior to the L4-L5 and L5-S1 disc levels.   Electronically Signed    By: Marcello Moores  Register   On: 08/22/2014 13:19   Dg Spine Portable 1 View  08/22/2014   CLINICAL DATA:  Low back pain radiating to the left leg. Intraoperative assessment of vertebral level.  EXAM: PORTABLE SPINE - 1 VIEW  COMPARISON:  08/20/2014  FINDINGS: The lowest lumbar type non-rib-bearing vertebra is labeled as L5. The long axis of the curved tip probe is oriented at the L5-S1 level, with tissue spreaders in place.  IMPRESSION: 1. Probe localization of L5-S1.   Electronically Signed   By: Sherryl Barters M.D.   On: 08/22/2014 12:14    EKG:No orders found for this or any previous visit.   Hospital Course: Patient was admitted to [facility] and taken to the OR and underwent the above state procedure without complications.  Patient tolerated the procedure well and was later transferred to the recovery room and then to the orthopaedic floor for postoperative care.  They were given PO and IV analgesics for pain control following their surgery.  They were given 24 hours of postoperative antibiotics.   PT was consulted postop to assist with mobility and transfers.  The patient was allowed to be WBAT with therapy and was taught back precautions. Discharge planning was consulted to help with postop disposition and equipment needs.  Patient had a good night on the evening of surgery and started to get up OOB with therapy on day one. Patient was seen in rounds and was ready to go home on day one.  They were given discharge instructions and dressing directions.  They were instructed on when to follow up in the office with Dr. Tonita Cong.   Diet: Regular diet Activity:WBAT; Lspine precautions Follow-up:in 10-14 days Disposition - Home Discharged Condition: good   Discharge Instructions    Call MD / Call 911    Complete by:  As directed   If you experience chest pain or shortness of breath, CALL 911 and be transported to the hospital emergency room.  If you develope a fever above 101 F, pus (white  drainage) or increased drainage or redness at the wound, or calf pain, call your surgeon's office.     Constipation Prevention    Complete by:  As directed   Drink plenty of fluids.  Prune juice may be helpful.  You may use a stool softener, such as Colace (over the counter) 100 mg twice a day.  Use MiraLax (over the counter) for constipation as needed.     Diet - low sodium heart healthy    Complete by:  As directed      Increase activity slowly  as tolerated    Complete by:  As directed             Medication List    TAKE these medications        methocarbamol 500 MG tablet  Commonly known as:  ROBAXIN  Take 1 tablet (500 mg total) by mouth every 8 (eight) hours as needed for muscle spasms.     oxyCODONE-acetaminophen 5-325 MG per tablet  Commonly known as:  PERCOCET  Take 1 tablet by mouth every 4 (four) hours as needed.           Follow-up Information    Follow up with BEANE,JEFFREY C, MD In 2 weeks.   Specialty:  Orthopedic Surgery   Contact information:   [address] [place] [address] [phone]       Signed: Lacie Draft, PA-C Orthopaedic Surgery 08/23/2014, 10:41 AM

## 2014-08-23 NOTE — Evaluation (Signed)
Physical Therapy Evaluation Patient Details Name: Terry Friedman MRN: [id number] DOB: 10-17-1970 Today's Date: 08/23/2014   History of Present Illness  Pt is s/p micro lumbar decompression L5-S1, left  Clinical Impression  Pt to D/C today, no further Pt needs at this time    Follow Up Recommendations No PT follow up    Equipment Recommendations  None recommended by PT    Recommendations for Other Services       Precautions / Restrictions Precautions Precautions: Back Precaution Comments: reviewed back precautions      Mobility  Bed Mobility               General bed mobility comments: pt in chair  Transfers Overall transfer level: Needs assistance Equipment used: None Transfers: Sit to/from Stand Sit to Stand: Supervision;Modified independent (Device/Increase time)         General transfer comment: verbal cues for back precautions.  Ambulation/Gait Ambulation/Gait assistance: Supervision;Independent Ambulation Distance (Feet): 300 Feet Assistive device: None Gait Pattern/deviations: Step-through pattern     General Gait Details: decr trunk rotation  Stairs Stairs: Yes Stairs assistance: Supervision Stair Management: One rail Right;One rail Left Number of Stairs: 4 General stair comments: cues for sequence  Wheelchair Mobility    Modified Rankin (Stroke Patients Only)       Balance                                             Pertinent Vitals/Pain Pain Assessment: 0-10 Pain Score: 2  Pain Location: back Pain Descriptors / Indicators: Aching Pain Intervention(s): Monitored during session;Limited activity within patient's tolerance    Home Living Family/patient expects to be discharged to:: Private residence Living Arrangements: Spouse/significant other Available Help at Discharge: Family Type of Home: Mobile home Home Access: Stairs to enter Entrance Stairs-Rails: Right;Left;Can reach both Entrance Stairs-Number  of Steps: 6 Home Layout: One level Home Equipment: None      Prior Function Level of Independence: Independent               Hand Dominance        Extremity/Trunk Assessment   Upper Extremity Assessment: Defer to OT evaluation           Lower Extremity Assessment: Overall WFL for tasks assessed         Communication   Communication: No difficulties  Cognition Arousal/Alertness: Awake/alert Behavior During Therapy: WFL for tasks assessed/performed Overall Cognitive Status: Within Functional Limits for tasks assessed                      General Comments      Exercises        Assessment/Plan    PT Assessment Patent does not need any further PT services  PT Diagnosis Difficulty walking   PT Problem List    PT Treatment Interventions     PT Goals (Current goals can be found in the Care Plan section) Acute Rehab PT Goals Patient Stated Goal: home PT Goal Formulation: With patient/family    Frequency     Barriers to discharge        Co-evaluation               End of Session   Activity Tolerance: Patient tolerated treatment well Patient left: in chair;with call bell/phone within reach;with family/visitor present Nurse Communication: Mobility status    Functional Assessment  Tool Used: clinical judgement Functional Limitation: Mobility: Walking and moving around Mobility: Walking and Moving Around Current Status [phone](G8978): At least 1 percent but less than 20 percent impaired, limited or restricted Mobility: Walking and Moving Around Goal Status [phone](G8979): 0 percent impaired, limited or restricted Mobility: Walking and Moving Around Discharge Status [phone](G8980): 0 percent impaired, limited or restricted    Time: [id number] PT Time Calculation (min) (ACUTE ONLY): 28 min   Charges:   PT Evaluation $Initial PT Evaluation Tier I: 1 Procedure PT Treatments $Gait Training: 23-37 mins   PT G Codes:   Functional Assessment Tool Used:  clinical judgement Functional Limitation: Mobility: Walking and moving around    [facility] Surgery Center LtdWILLIAMS,Raianna Slight 08/23/2014, 1:23 PM

## 2014-08-23 NOTE — Plan of Care (Signed)
Problem: Phase I Progression Outcomes Goal: Initial discharge plan identified Outcome: Completed/Met Date Met:  08/23/14 Goal: PT/OT consults requested Outcome: Completed/Met Date Met:  08/23/14  Problem: Discharge Progression Outcomes Goal: Discharge plan in place and appropriate Outcome: Not Applicable Date Met:  41/58/30 Goal: Pain controlled with appropriate interventions Outcome: Completed/Met Date Met:  08/23/14 Goal: Hemodynamically stable Outcome: Completed/Met Date Met:  94/07/68 Goal: Complications resolved/controlled Outcome: Completed/Met Date Met:  08/23/14 Goal: Tolerates diet Outcome: Completed/Met Date Met:  08/23/14 Goal: Incision without S/S infection Outcome: Completed/Met Date Met:  08/23/14 Goal: Ambulates without assistance Outcome: Completed/Met Date Met:  08/23/14 Goal: Demonstrates proper use of assistive devices Outcome: Completed/Met Date Met:  08/23/14

## 2014-08-30 ENCOUNTER — Ambulatory Visit: Payer: Worker's Compensation | Attending: Specialist | Admitting: Physical Therapy

## 2014-08-30 DIAGNOSIS — M545 Low back pain: Secondary | ICD-10-CM | POA: Insufficient documentation

## 2014-08-30 DIAGNOSIS — R5381 Other malaise: Secondary | ICD-10-CM | POA: Diagnosis not present

## 2014-08-30 DIAGNOSIS — Z5189 Encounter for other specified aftercare: Secondary | ICD-10-CM | POA: Insufficient documentation

## 2014-08-30 DIAGNOSIS — Z4789 Encounter for other orthopedic aftercare: Secondary | ICD-10-CM | POA: Insufficient documentation

## 2014-09-11 ENCOUNTER — Ambulatory Visit: Payer: Worker's Compensation | Attending: Specialist | Admitting: *Deleted

## 2014-09-11 DIAGNOSIS — R5381 Other malaise: Secondary | ICD-10-CM | POA: Diagnosis not present

## 2014-09-11 DIAGNOSIS — Z5189 Encounter for other specified aftercare: Secondary | ICD-10-CM | POA: Insufficient documentation

## 2014-09-11 DIAGNOSIS — Z4789 Encounter for other orthopedic aftercare: Secondary | ICD-10-CM | POA: Diagnosis not present

## 2014-09-11 DIAGNOSIS — M545 Low back pain: Secondary | ICD-10-CM | POA: Diagnosis not present

## 2014-09-13 ENCOUNTER — Ambulatory Visit: Payer: Worker's Compensation | Admitting: *Deleted

## 2014-09-13 DIAGNOSIS — Z5189 Encounter for other specified aftercare: Secondary | ICD-10-CM | POA: Diagnosis not present

## 2014-09-18 ENCOUNTER — Ambulatory Visit: Payer: Worker's Compensation | Admitting: Physical Therapy

## 2014-09-18 DIAGNOSIS — Z5189 Encounter for other specified aftercare: Secondary | ICD-10-CM | POA: Diagnosis not present

## 2014-09-20 ENCOUNTER — Ambulatory Visit: Payer: Worker's Compensation | Admitting: Physical Therapy

## 2014-09-20 DIAGNOSIS — Z5189 Encounter for other specified aftercare: Secondary | ICD-10-CM | POA: Diagnosis not present

## 2014-09-25 ENCOUNTER — Ambulatory Visit: Payer: Worker's Compensation | Admitting: Physical Therapy

## 2014-09-25 DIAGNOSIS — Z5189 Encounter for other specified aftercare: Secondary | ICD-10-CM | POA: Diagnosis not present

## 2014-09-27 ENCOUNTER — Ambulatory Visit: Payer: Worker's Compensation | Admitting: Physical Therapy

## 2014-09-27 DIAGNOSIS — Z5189 Encounter for other specified aftercare: Secondary | ICD-10-CM | POA: Diagnosis not present

## 2014-10-02 ENCOUNTER — Ambulatory Visit: Payer: Worker's Compensation | Admitting: *Deleted

## 2014-10-02 DIAGNOSIS — Z5189 Encounter for other specified aftercare: Secondary | ICD-10-CM | POA: Diagnosis not present

## 2015-03-14 ENCOUNTER — Ambulatory Visit: Payer: Self-pay | Admitting: Physician Assistant

## 2015-07-10 IMAGING — DX DG SPINE 1V PORT
1 series · 1 of 1 positions shown · non-contrast
Comparison: 08/20/2014.

CLINICAL DATA: Back surgery.

EXAM:
PORTABLE SPINE - 1 VIEW

[l-spine x-table]
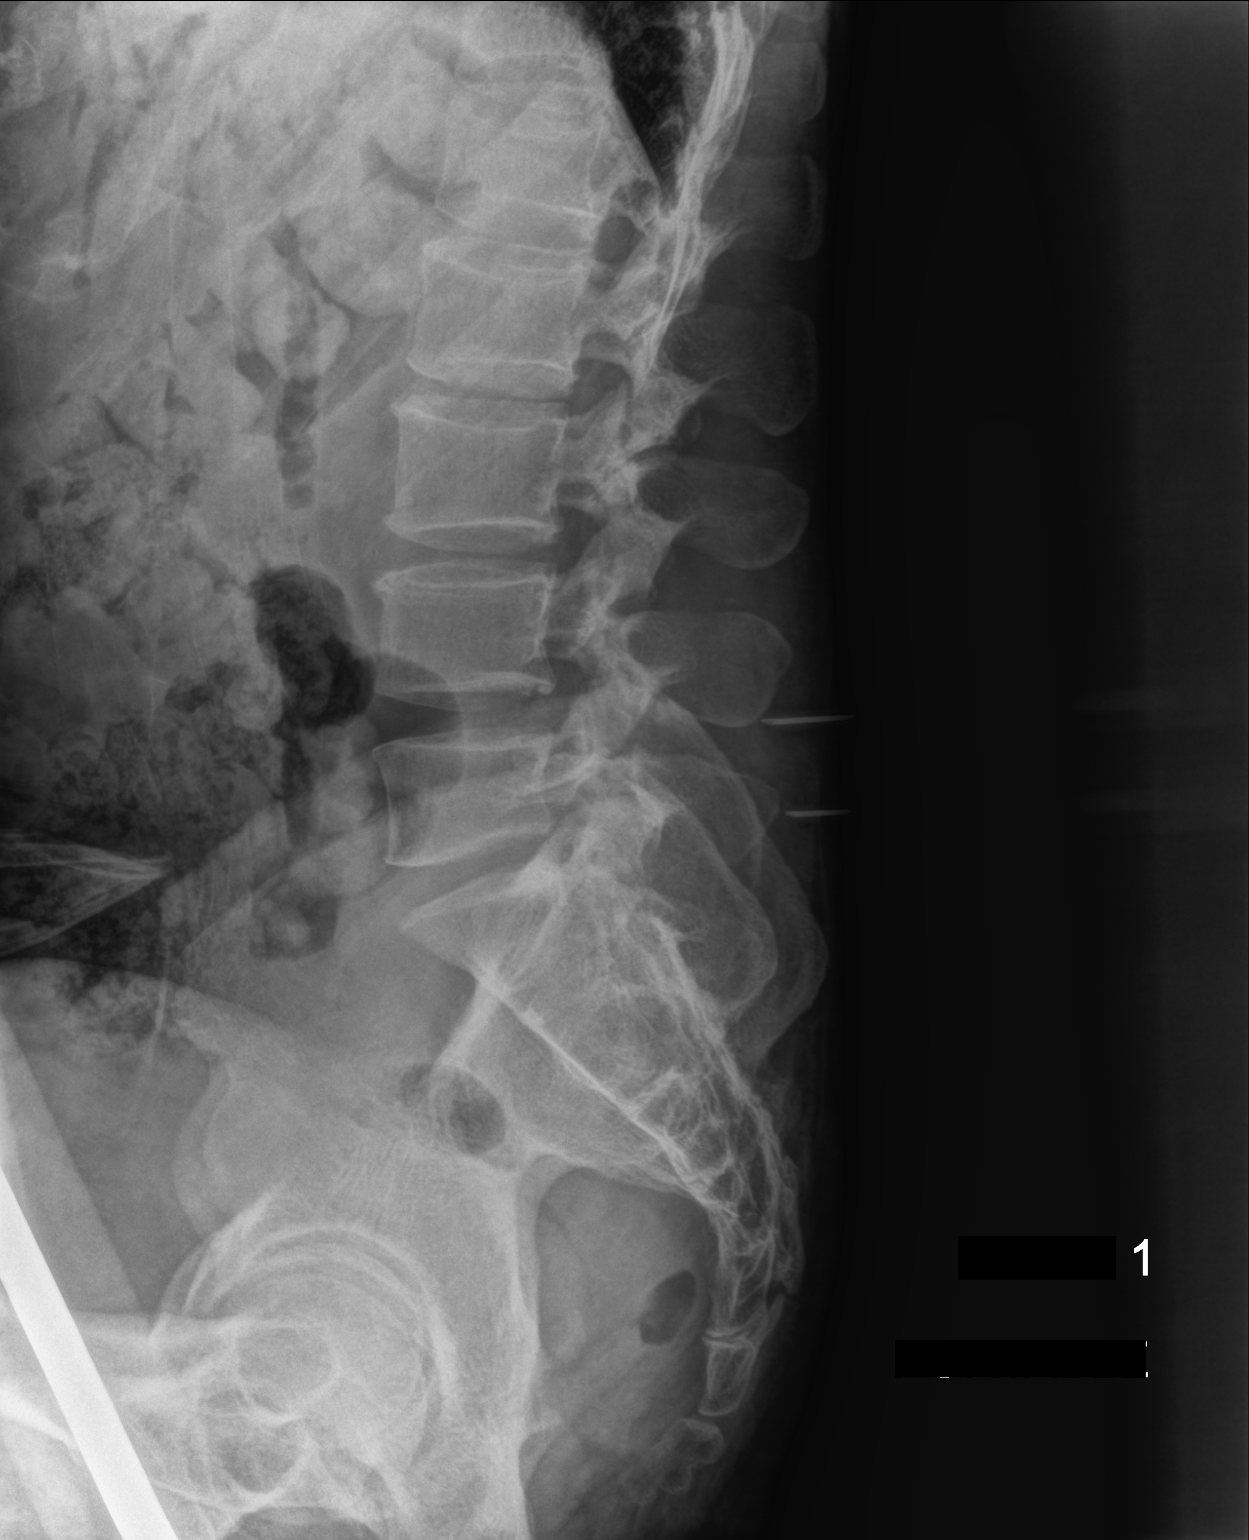

[1 of 1 positions shown; findings below may reference images not displayed]

FINDINGS: Metallic markers are noted posteriorly at the L4-L5 and L5-S1 disc
levels. Degenerative changes lumbar spine.
IMPRESSION: Metallic markers noted posterior to the L4-L5 and L5-S1 disc levels.

## 2015-07-10 IMAGING — DX DG SPINE 1V PORT
1 series · 1 of 1 positions shown · non-contrast
Comparison: Lateral view of the lumbar spine labeled image #1.

CLINICAL DATA: Planned surgical level with L5-S1

EXAM:
PORTABLE SPINE - 1 VIEW

[l-spine x-table]
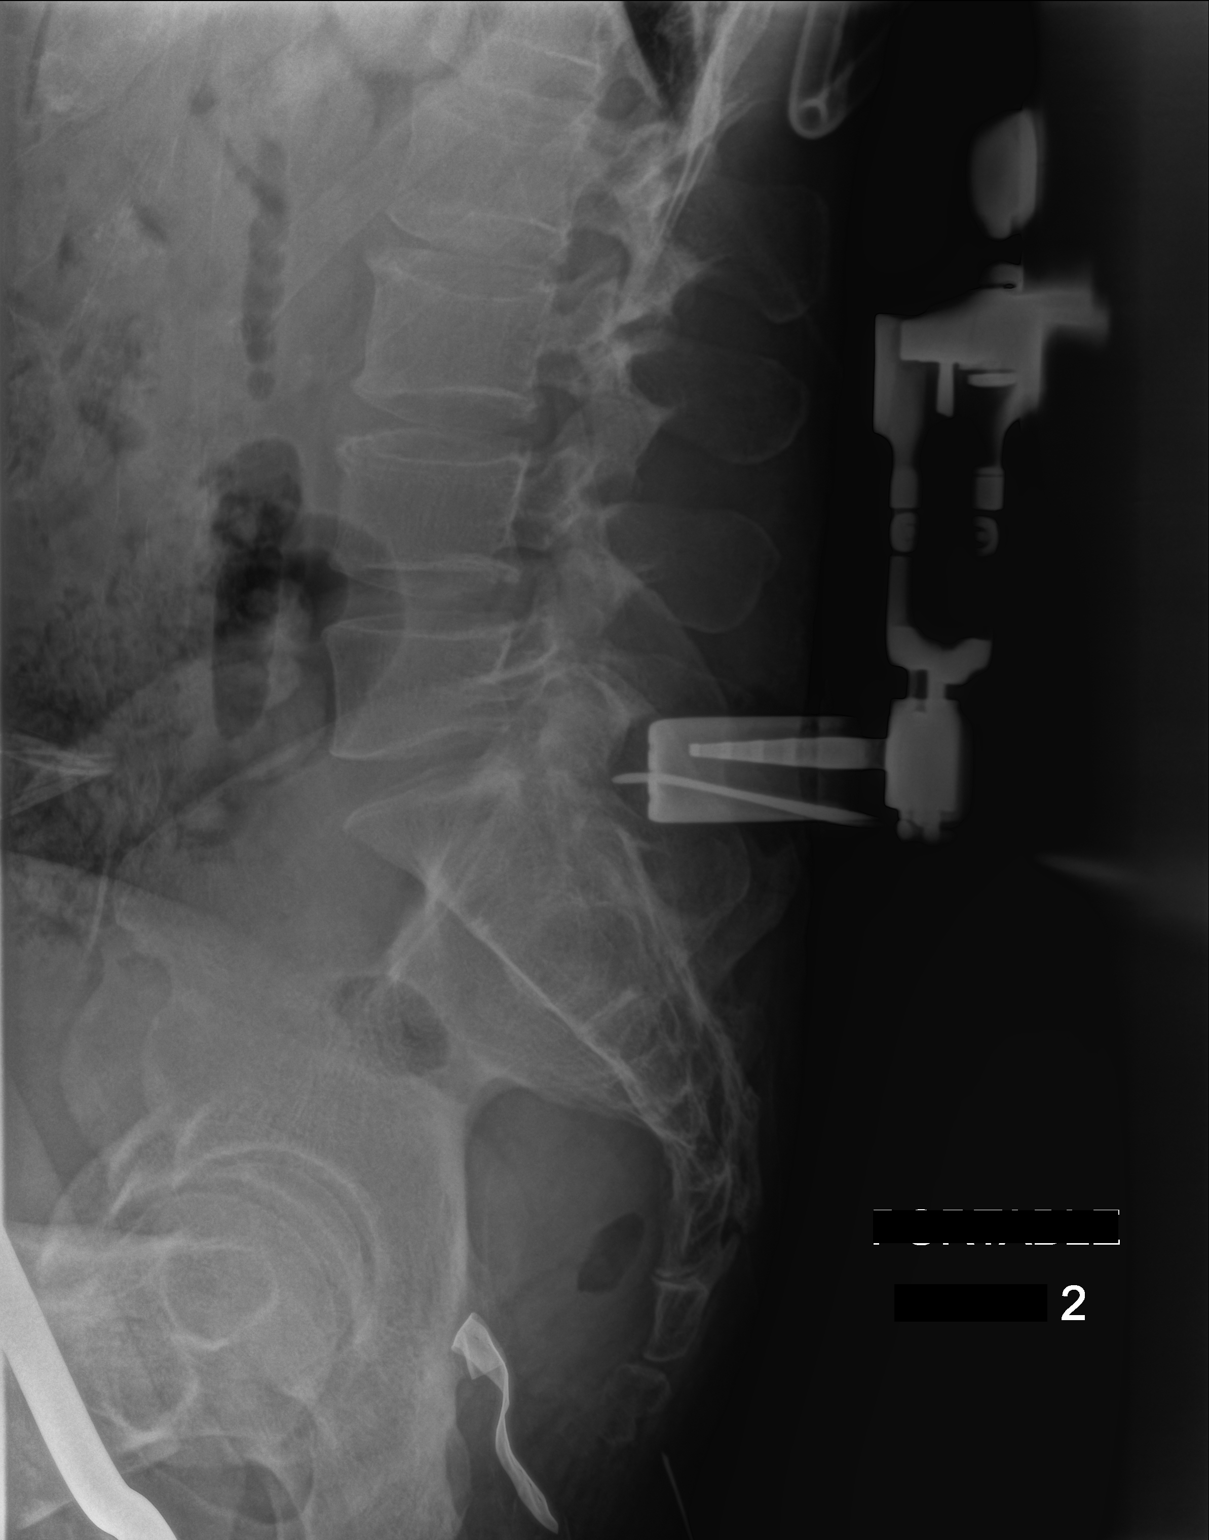

[1 of 1 positions shown; findings below may reference images not displayed]

FINDINGS: The scan spur under device and the metallic trocar overlie the
inferior aspect of the L5 spinous process. The tip of the trocar
lies 2.8 cm posterior to the posterior margin of the L5-S1 disc
space.
IMPRESSION: The mid metallic heart wire positioning is as described.

## 2019-04-06 ENCOUNTER — Other Ambulatory Visit: Payer: Self-pay

## 2019-04-06 DIAGNOSIS — Z20822 Contact with and (suspected) exposure to covid-19: Secondary | ICD-10-CM

## 2019-04-12 LAB — NOVEL CORONAVIRUS, NAA: SARS-CoV-2, NAA: NOT DETECTED
